# Patient Record
Sex: Female | Born: 1975 | Race: White | Hispanic: No | Marital: Married | Smoking: Never smoker
Health system: Southern US, Community
[De-identification: ages and names within clinical notes are randomized; demographics above are authoritative.]

## PROBLEM LIST (undated history)

## (undated) DIAGNOSIS — F419 Anxiety disorder, unspecified: Secondary | ICD-10-CM

## (undated) DIAGNOSIS — F329 Major depressive disorder, single episode, unspecified: Secondary | ICD-10-CM

## (undated) DIAGNOSIS — N939 Abnormal uterine and vaginal bleeding, unspecified: Secondary | ICD-10-CM

## (undated) DIAGNOSIS — F32A Depression, unspecified: Secondary | ICD-10-CM

## (undated) HISTORY — DX: Abnormal uterine and vaginal bleeding, unspecified: N93.9

---

## 1999-11-25 ENCOUNTER — Other Ambulatory Visit: Admission: RE | Admit: 1999-11-25 | Discharge: 1999-11-25 | Payer: Self-pay | Admitting: Gynecology

## 2000-02-25 ENCOUNTER — Other Ambulatory Visit: Admission: RE | Admit: 2000-02-25 | Discharge: 2000-02-25 | Payer: Self-pay | Admitting: Gynecology

## 2000-08-02 ENCOUNTER — Other Ambulatory Visit: Admission: RE | Admit: 2000-08-02 | Discharge: 2000-08-02 | Payer: Self-pay | Admitting: Obstetrics and Gynecology

## 2000-08-24 ENCOUNTER — Encounter (INDEPENDENT_AMBULATORY_CARE_PROVIDER_SITE_OTHER): Payer: Self-pay

## 2000-08-24 ENCOUNTER — Other Ambulatory Visit: Admission: RE | Admit: 2000-08-24 | Discharge: 2000-08-24 | Payer: Self-pay | Admitting: Obstetrics and Gynecology

## 2001-01-09 ENCOUNTER — Other Ambulatory Visit: Admission: RE | Admit: 2001-01-09 | Discharge: 2001-01-09 | Payer: Self-pay | Admitting: Obstetrics and Gynecology

## 2001-03-30 ENCOUNTER — Ambulatory Visit (HOSPITAL_BASED_OUTPATIENT_CLINIC_OR_DEPARTMENT_OTHER): Admission: RE | Admit: 2001-03-30 | Discharge: 2001-03-30 | Payer: Self-pay | Admitting: Surgery

## 2001-03-30 ENCOUNTER — Encounter (INDEPENDENT_AMBULATORY_CARE_PROVIDER_SITE_OTHER): Payer: Self-pay | Admitting: Specialist

## 2001-04-14 ENCOUNTER — Other Ambulatory Visit: Admission: RE | Admit: 2001-04-14 | Discharge: 2001-04-14 | Payer: Self-pay | Admitting: Obstetrics and Gynecology

## 2001-07-21 ENCOUNTER — Other Ambulatory Visit: Admission: RE | Admit: 2001-07-21 | Discharge: 2001-07-21 | Payer: Self-pay | Admitting: Obstetrics and Gynecology

## 2001-12-25 ENCOUNTER — Other Ambulatory Visit: Admission: RE | Admit: 2001-12-25 | Discharge: 2001-12-25 | Payer: Self-pay | Admitting: Gynecology

## 2002-04-04 ENCOUNTER — Other Ambulatory Visit: Admission: RE | Admit: 2002-04-04 | Discharge: 2002-04-04 | Payer: Self-pay | Admitting: *Deleted

## 2002-07-19 ENCOUNTER — Inpatient Hospital Stay (HOSPITAL_COMMUNITY): Admission: AD | Admit: 2002-07-19 | Discharge: 2002-07-21 | Payer: Self-pay | Admitting: *Deleted

## 2002-08-29 ENCOUNTER — Other Ambulatory Visit: Admission: RE | Admit: 2002-08-29 | Discharge: 2002-08-29 | Payer: Self-pay | Admitting: Gynecology

## 2003-03-22 ENCOUNTER — Other Ambulatory Visit: Admission: RE | Admit: 2003-03-22 | Discharge: 2003-03-22 | Payer: Self-pay | Admitting: Gynecology

## 2003-10-21 ENCOUNTER — Other Ambulatory Visit: Admission: RE | Admit: 2003-10-21 | Discharge: 2003-10-21 | Payer: Self-pay | Admitting: Gynecology

## 2004-10-26 ENCOUNTER — Other Ambulatory Visit: Admission: RE | Admit: 2004-10-26 | Discharge: 2004-10-26 | Payer: Self-pay | Admitting: Gynecology

## 2005-07-12 ENCOUNTER — Other Ambulatory Visit: Admission: RE | Admit: 2005-07-12 | Discharge: 2005-07-12 | Payer: Self-pay | Admitting: Gynecology

## 2005-12-14 ENCOUNTER — Ambulatory Visit (HOSPITAL_COMMUNITY): Admission: RE | Admit: 2005-12-14 | Discharge: 2005-12-14 | Payer: Self-pay | Admitting: Gynecology

## 2006-01-18 ENCOUNTER — Encounter (INDEPENDENT_AMBULATORY_CARE_PROVIDER_SITE_OTHER): Payer: Self-pay | Admitting: *Deleted

## 2006-01-18 ENCOUNTER — Inpatient Hospital Stay (HOSPITAL_COMMUNITY): Admission: AD | Admit: 2006-01-18 | Discharge: 2006-01-20 | Payer: Self-pay | Admitting: Gynecology

## 2006-02-28 ENCOUNTER — Other Ambulatory Visit: Admission: RE | Admit: 2006-02-28 | Discharge: 2006-02-28 | Payer: Self-pay | Admitting: Gynecology

## 2006-11-17 IMAGING — CR DG CHEST 2V
2 series · 2 of 2 positions shown · non-contrast
Comparison: none

CLINICAL DATA: cough and congestion
 [id number] VIEWS:
 The lungs are clear.  Cardiac and mediastinal contours are normal.  
 Patient is 34 weeks pregnant.  Patient was shielded.

[view not recorded (1 of 2)]
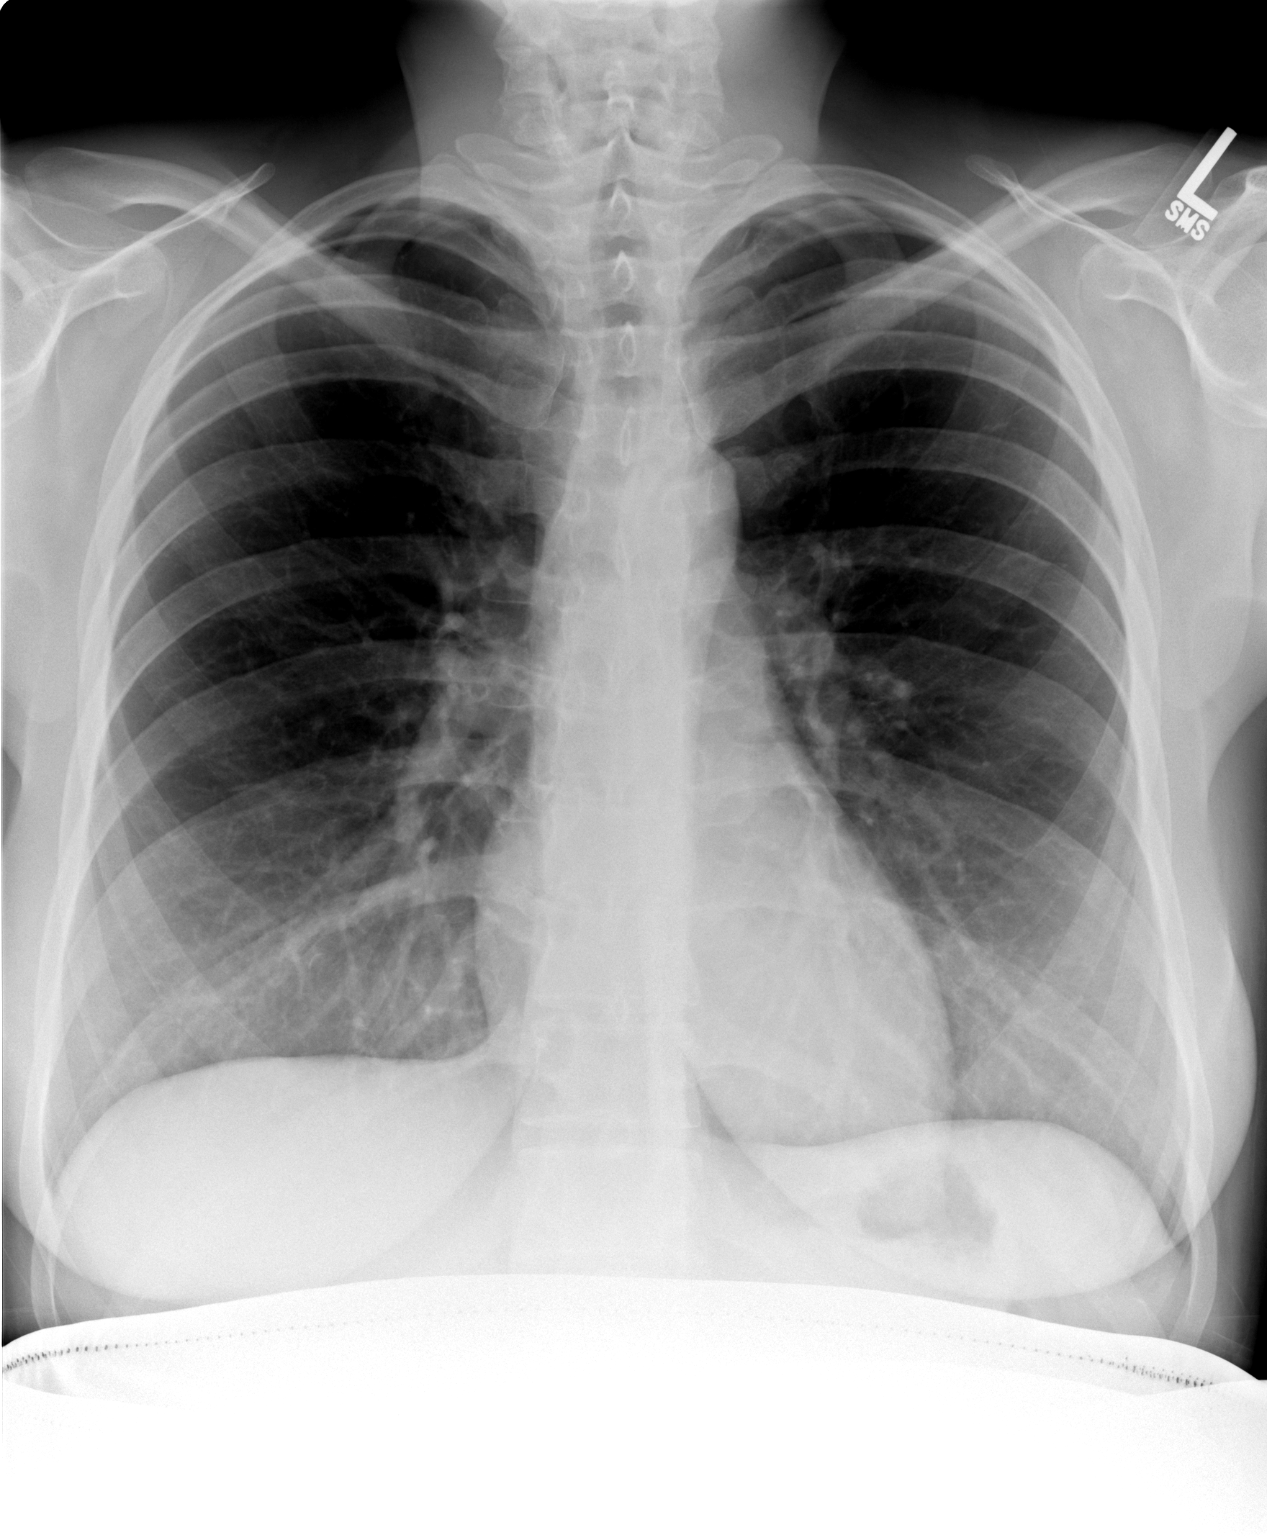

[view not recorded (2 of 2)]
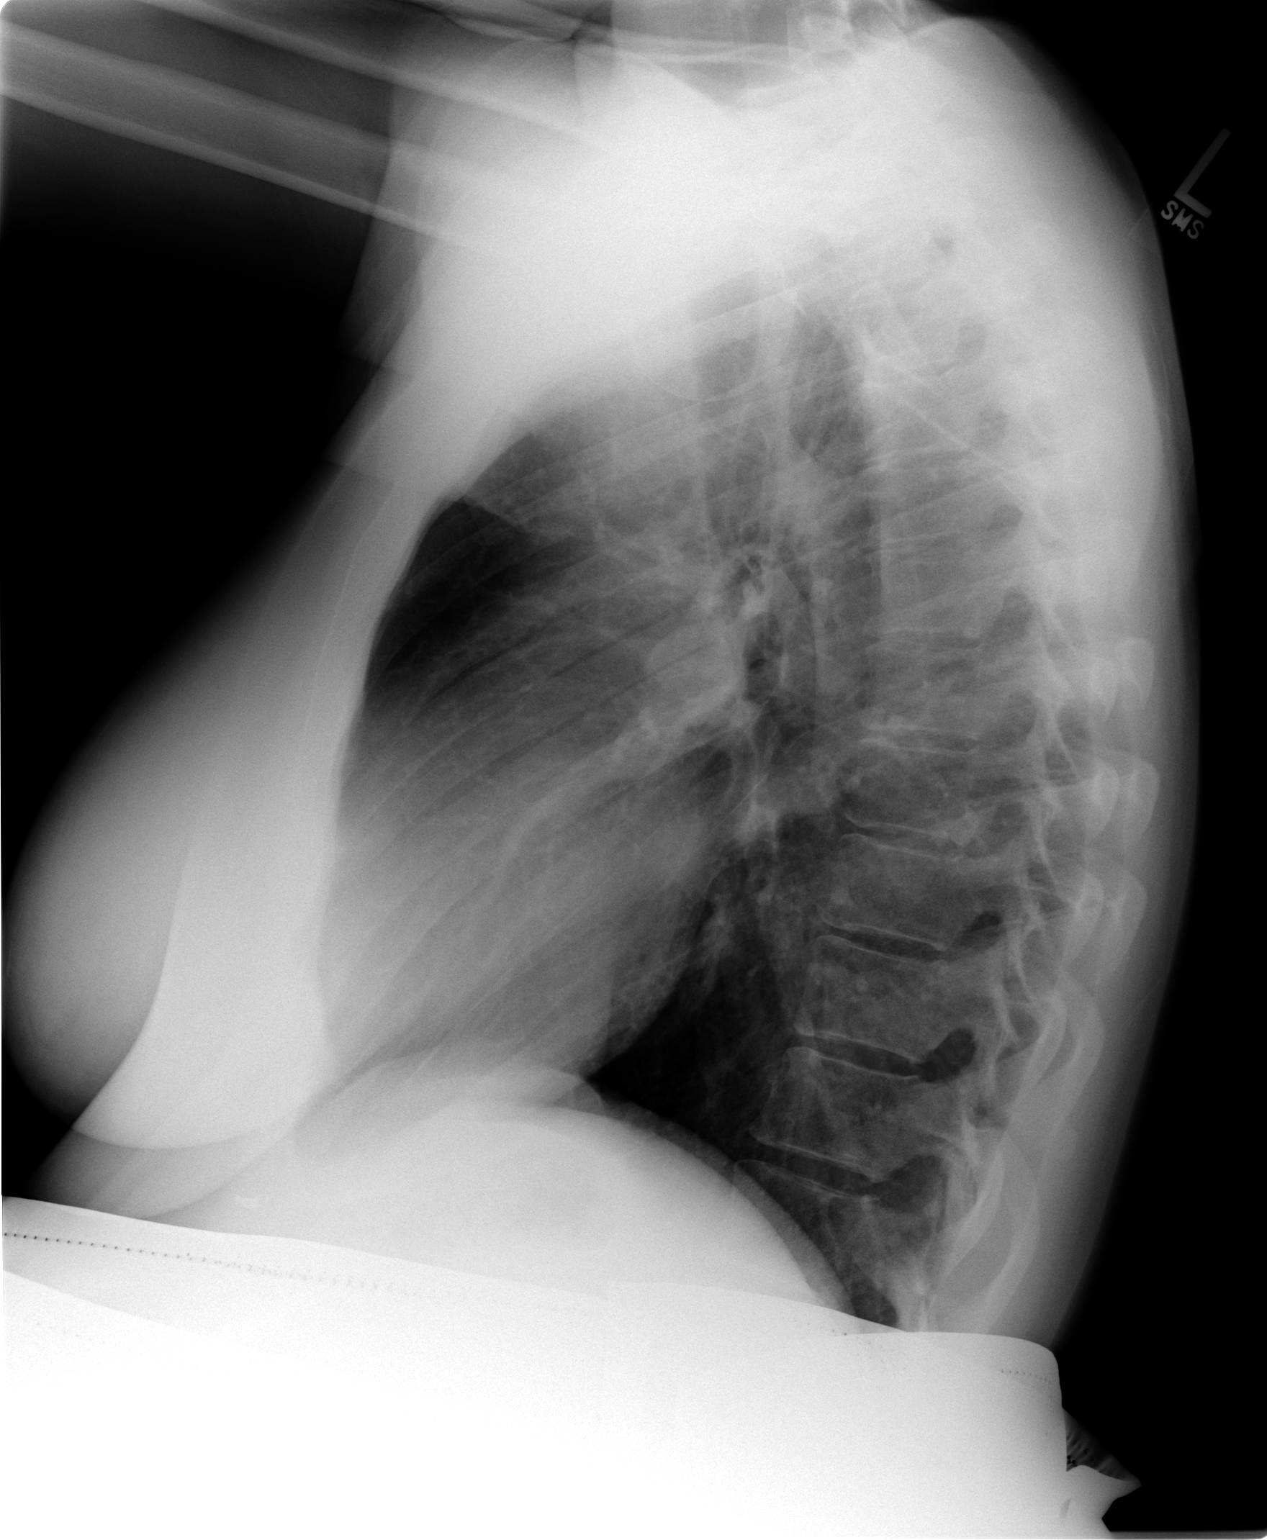

[2 of 2 positions shown; findings below may reference images not displayed]

IMPRESSION: Negative for acute cardiac or pulmonary process.

## 2007-03-06 ENCOUNTER — Other Ambulatory Visit: Admission: RE | Admit: 2007-03-06 | Discharge: 2007-03-06 | Payer: Self-pay | Admitting: Gynecology

## 2008-03-15 ENCOUNTER — Other Ambulatory Visit: Admission: RE | Admit: 2008-03-15 | Discharge: 2008-03-15 | Payer: Self-pay | Admitting: Gynecology

## 2009-03-31 ENCOUNTER — Ambulatory Visit: Payer: Self-pay | Admitting: Gynecology

## 2009-03-31 ENCOUNTER — Encounter: Payer: Self-pay | Admitting: Gynecology

## 2009-03-31 ENCOUNTER — Other Ambulatory Visit: Admission: RE | Admit: 2009-03-31 | Discharge: 2009-03-31 | Payer: Self-pay | Admitting: Gynecology

## 2010-04-03 ENCOUNTER — Ambulatory Visit: Payer: Self-pay | Admitting: Gynecology

## 2010-04-03 ENCOUNTER — Other Ambulatory Visit: Admission: RE | Admit: 2010-04-03 | Discharge: 2010-04-03 | Payer: Self-pay | Admitting: Gynecology

## 2010-10-19 ENCOUNTER — Ambulatory Visit: Payer: Self-pay | Admitting: Gynecology

## 2011-05-07 NOTE — Discharge Summary (Signed)
   Renee Harvey, Renee Harvey                        ACCOUNT NO.:  [id number]   MEDICAL RECORD NO.:  [id number]                   PATIENT TYPE:  INP   LOCATION:  9120                                 [facility]   PHYSICIAN:  Devin M. Ciliberti, M.D.            DATE OF BIRTH:  17-Jul-1976   DATE OF ADMISSION:  07/19/2002  DATE OF DISCHARGE:  07/21/2002                                 DISCHARGE SUMMARY   DISCHARGE DIAGNOSES:  1. Intrauterine pregnancy, 40+ weeks delivered, status post spontaneous     vaginal delivery.  2. Positive group B strep.   HISTORY:  This is a 35 year old female gravida 1 para 0 with an EDC of July 15, 2002.  Prenatal course was uncomplicated.   HOSPITAL COURSE:  On July 19, 2002 the patient was admitted in labor with  nausea and vomiting.  She also had a history of positive GBS, thus was begun  on Penicillin G per protocol and was given IV Reglan.  The patient  subsequently on July 19, 2002 underwent spontaneous vaginal delivery of a  female, Apgars 8 and 9, weight 8 pounds 15 ounces.  There was a partial  third degree laceration, midline episiotomy.  Postpartum the patient was  afebrile, voiding, in stable condition.  She was discharged to home on  July 21, 2002.   INSTRUCTIONS:  Given [facility] Gynecology postpartum instructions and  postpartum booklet.   ACCESSORY LABORATORY DATA:  The patient is O positive, rubella immune.  Hemoglobin on July 20, 2002 was 8.7.   DISPOSITION:  The patient is discharged to home in satisfactory condition.   FOLLOW-UP:  Informed to return to the office in six weeks; if she had any  problem prior to that time, to be seen in the office.     Susa Loffler, P.A.                    Devin M. Ciliberti, M.D.    TSG/MEDQ  D:  08/17/2002  T:  08/17/2002  Job:  [id number]

## 2011-05-07 NOTE — H&P (Signed)
Renee Harvey, Renee Harvey                        ACCOUNT NO.:  [id number]   MEDICAL RECORD NO.:  [id number]                   PATIENT TYPE:  INP   LOCATION:  9120                                 [facility]   PHYSICIAN:  Juan H. Lily Peer, M.D.             DATE OF BIRTH:  03-12-1976   DATE OF ADMISSION:  07/19/2002  DATE OF DISCHARGE:                                HISTORY & PHYSICAL   CHIEF COMPLAINT:  Contractions, nausea, and vomiting.   HISTORY OF PRESENT ILLNESS:  The patient is a 35 year old, gravida 1, para  0, with an estimated date of confinement of July 15, 2002 at apparently 40-  4/7th weeks gestation.  She was seen yesterday afternoon in the in the  office in what appeared to be early labor.  Her cervix was found to 1 cm,  80% effaced, -3 station.  She presented to the [facility] this morning  complaining of increasing intensity of her contractions as well as  frequency, apparently every five to seven minutes part, and nausea.  She was  placed on the monitor and was found to be contracting every four to seven  minutes apart with a reassuring fetal heart rate tracing.  Her vital signs  were as follows blood pressure 137/76, pulse  69, respirations 12,  temperature 98.0.   PRENATAL COURSE:  Review of her medical records indicated her prenatal  course was significant that she had ascus on her new OB Pap smear and was  repeated on April 04, 2002, which was normal.  She had some positive nausea  and vomiting early in the pregnancy which contributed to her weight loss but  we regained her weight later in the second and third trimester respectively.  She had otherwise done well through most of her pregnancy.   ALLERGIES:  The patient denies any medical allergies.   PAST MEDICAL HISTORY:  She had positive group B strep culture.  There has  never been any history of STD in the past.   REVIEW OF SYSTEMS:  See [place] form.   PHYSICAL EXAMINATION:  VITAL SIGNS:  As  described above.  HEENT:  Unremarkable.  NECK:  Supple.  Trachea midline.  No carotid bruits and no thyromegaly.  LUNGS:  Clear to auscultation without rigors or wheezes.  HEART:  Regular rate and rhythm without any murmurs or gallops.  BREASTS:  Not done.  ABDOMEN:  Gravid uterus. Vertex presentation upon [facility] maneuver.  Positive fetal heart sounds.  CERVIX:  The cervix is 3 cm dilated, 90% effaced, bulging membranes intact.  EXTREMITIES:  DTR 1+.  Clonus trace edema.   PRENATAL LABORATORY DATA:  Blood type is 0 positive, negative antibody  screen.  VDRL is nonreactive.  Rubella immune.  Hepatitis B surface antigen  and HIV were negative.  Pap smear was normal.  Alpha fetoprotein was normal.  __________ screen was normal.  Positive GBS culture.  ASSESSMENT:  A 35 year old, gravida 1, para 0, at 40-4/7th weeks gestation  in labor with advanced cervical dilatation 3+ cm, bulging membranes, 90%  effaced, intact with a reassuring fetal heart rate tracing.  Positive group  B streptococcus culture.   PLAN:  The patient will receive an epidural as per request due to discomfort  and intensity of her contractions.  Once the epidural is on board, we will  go ahead and proceed with artificial rupture of membrane and placement of  fetal scalp electrodes and intrauterine pressure catheter.  She will be  given 10 mg of Reglan IV for her nausea and she will be started on Pen-G 5  mU IV followed by 2.5 mU q.4h. secondary to her positive GBS status.  Anticipate vaginal delivery.                                               Juan H. Lily Peer, M.D.    JHF/MEDQ  D:  07/19/2002  T:  07/20/2002  Job:  [id number]

## 2011-05-21 ENCOUNTER — Encounter (HOSPITAL_COMMUNITY): Payer: Self-pay | Admitting: *Deleted

## 2011-05-22 ENCOUNTER — Inpatient Hospital Stay (HOSPITAL_COMMUNITY)
Admission: AD | Admit: 2011-05-22 | Discharge: 2011-05-24 | DRG: 373 | Disposition: A | Discharge status: Home / Self Care | Payer: BC Managed Care – PPO | Source: Ambulatory Visit | Attending: Obstetrics and Gynecology | Admitting: Obstetrics and Gynecology

## 2011-05-22 DIAGNOSIS — O09529 Supervision of elderly multigravida, unspecified trimester: Secondary | ICD-10-CM | POA: Diagnosis present

## 2011-05-22 DIAGNOSIS — O3660X0 Maternal care for excessive fetal growth, unspecified trimester, not applicable or unspecified: Principal | ICD-10-CM | POA: Diagnosis present

## 2011-05-22 LAB — CBC
HCT: 35.2 % — ABNORMAL LOW (ref 36.0–46.0)
MCHC: 33.5 g/dL (ref 30.0–36.0)
Platelets: 243 10*3/uL (ref 150–400)
RDW: 13.1 % (ref 11.5–15.5)
WBC: 8.7 10*3/uL (ref 4.0–10.5)

## 2011-05-22 LAB — RPR: RPR Ser Ql: NONREACTIVE

## 2011-05-22 LAB — ABO/RH: ABO/RH(D): O POS

## 2011-05-23 LAB — CBC
HCT: 32.3 % — ABNORMAL LOW (ref 36.0–46.0)
MCHC: 33.1 g/dL (ref 30.0–36.0)
Platelets: 222 10*3/uL (ref 150–400)
RDW: 13.1 % (ref 11.5–15.5)
WBC: 13.2 10*3/uL — ABNORMAL HIGH (ref 4.0–10.5)

## 2011-05-30 ENCOUNTER — Inpatient Hospital Stay (HOSPITAL_COMMUNITY): Admission: AD | Admit: 2011-05-30 | Payer: Self-pay | Source: Home / Self Care | Admitting: Obstetrics and Gynecology

## 2011-06-25 NOTE — H&P (Signed)
  NAMEBLIMY, Renee Harvey            ACCOUNT NO.:  [id number]  MEDICAL RECORD NO.:  [id number]           PATIENT TYPE:  I  LOCATION:  9164                          [facility]  PHYSICIAN:  Lenoard Aden, M.D.DATE OF BIRTH:  11-13-1976  DATE OF ADMISSION:  05/22/2011 DATE OF DISCHARGE:                             HISTORY & PHYSICAL   INDICATION FOR INDUCTION:  Favorable cervix with questionable LGA and a history of LGA at 39 weeks.  She is a 35 year old white female G3, P2 with an EDD of May 29, 2011, who presents as noted for induction.  Her prenatal course complicated by a slightly increased Down syndrome risk of 1 in 221 on ultra screen followed by a normal ultrasound and normal followup ultrasound also complicated by migraine headaches for which she took Fioricet.  Her medications include Fioricet p.r.n. and prenatal vitamins.  She has no known drug allergies.  Social history is noncontributory.  Family history of breast cancer, kidney stones, migraine headache.  She has a history of 2 vaginal deliveries, one 8-pound 15-ounce and one 9 pounds fetus born in an uncomplicated fashion vaginally with an epidural.  On physical exam, she is a well-developed, well-nourished white female in no acute distress.  HEENT normal.  Neck supple.  Full range of motion.  Lungs are clear.  Heart regular rhythm.  Abdomen is soft, gravid, nontender.  Estimated fetal weight 7 pounds.  Cervix is 3 cm, 50%, vertex, -1, soft, mid position.  Extremities showed no cords. Neurological exam is nonfocal.  Skin is intact.  IMPRESSION:  39-week intrauterine pregnancy with favorable cervix and presumed large gestational age with a history of large gestational age for induction.  PLAN:  Proceed with induction, epidural p.r.n.     Lenoard Aden, M.D.     RJT/MEDQ  D:  05/22/2011  T:  05/22/2011  Job:  [id number]  Electronically Signed by Olivia Mackie M.D. on 06/25/2011 07:31:26 AM

## 2013-08-02 ENCOUNTER — Other Ambulatory Visit: Payer: Self-pay | Admitting: Dermatology

## 2014-10-21 ENCOUNTER — Encounter (HOSPITAL_COMMUNITY): Payer: Self-pay | Admitting: *Deleted

## 2017-05-17 ENCOUNTER — Emergency Department (HOSPITAL_COMMUNITY)
Admission: EM | Admit: 2017-05-17 | Discharge: 2017-05-18 | Disposition: A | Discharge status: Home / Self Care | Payer: BC Managed Care – PPO | Attending: Emergency Medicine | Admitting: Emergency Medicine

## 2017-05-17 ENCOUNTER — Encounter (HOSPITAL_COMMUNITY): Payer: Self-pay | Admitting: Emergency Medicine

## 2017-05-17 DIAGNOSIS — F4321 Adjustment disorder with depressed mood: Secondary | ICD-10-CM | POA: Diagnosis present

## 2017-05-17 DIAGNOSIS — R45851 Suicidal ideations: Secondary | ICD-10-CM

## 2017-05-17 NOTE — ED Notes (Signed)
Bed: [facility] Expected date:  Expected time:  Means of arrival:  Comments: 

## 2017-05-17 NOTE — ED Notes (Signed)
Patient's husband wishes to place his information in the chart in the case the counselor or the psychiatrist have any questions.  [name] [phone] States he can be called at any time.

## 2017-05-17 NOTE — ED Triage Notes (Signed)
Patient reports she has been seeing a therapist for a month due to increased self harming thoughts. Reports her "failing marriage" is a contributing factor to these thoughts. Denies SI/HI/A/V/H at this time.   Patient reports she wants her "soon to be ex husband to have nothing to do with her care."

## 2017-05-17 NOTE — ED Notes (Signed)
ED Provider at bedside. 
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
## 2017-05-17 NOTE — ED Notes (Addendum)
Patient refusing blood work until seen by physician. Reports she "just had blood work last week."

## 2017-05-17 NOTE — ED Notes (Signed)
Bed: FB51 Expected date:  Expected time:  Means of arrival:  Comments: Triage 3

## 2017-05-17 NOTE — ED Notes (Signed)
TTS at bedside. 

## 2017-05-17 NOTE — Progress Notes (Signed)
Per Patriciaann Clan NP recommend a.m. Psych evaluation Renee Harvey, LPC-A, [facility]  Counselor 05/17/2017 11:26 PM
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
## 2017-05-17 NOTE — ED Notes (Addendum)
Pt's text to husband reads "There is nothing more to reach out for.  As my journal says, I can't do this any longer. I can't hold back the self harming thoughts anymore. They are too strong and I alone am beaten down".  This RN witnessed accuracy.

## 2017-05-18 DIAGNOSIS — F4321 Adjustment disorder with depressed mood: Secondary | ICD-10-CM

## 2017-05-18 NOTE — ED Notes (Signed)
Pt discharged home. Discharged instructions read to pt who verbalized understanding. All belongings returned to pt who signed for same. Denies SI/HI, is not delusional and not responding to internal stimuli. Escorted pt to the ED exit.

## 2017-05-18 NOTE — [facility] Suicide Risk Assessment (Signed)
Suicide Risk Assessment  Discharge Assessment   [facility] Discharge Suicide Risk Assessment   Principal Problem: Adjustment disorder with depressed mood Discharge Diagnoses:  Patient Active Problem List   Diagnosis Date Noted  . Adjustment disorder with depressed mood [F43.21] 05/18/2017    Priority: High    Total Time spent with patient: 45 minutes   Musculoskeletal: Strength & Muscle Tone: within normal limits Gait & Station: normal Patient leans: N/A  Psychiatric Specialty Exam: Physical Exam  Constitutional: She is oriented to person, place, and time. She appears well-developed and well-nourished.  HENT:  Head: Normocephalic.  Neck: Normal range of motion.  Respiratory: Effort normal.  Musculoskeletal: Normal range of motion.  Neurological: She is alert and oriented to person, place, and time.  Psychiatric: Her behavior is normal. Judgment and thought content normal. Cognition and memory are normal. She exhibits a depressed mood.    Review of Systems  Psychiatric/Behavioral: Positive for depression.  All other systems reviewed and are negative.   Blood pressure 118/77, pulse 90, temperature 98.5 F (36.9 C), temperature source Oral, resp. rate 18, last menstrual period 05/03/2017, SpO2 100 %.There is no height or weight on file to calculate BMI.  General Appearance: Casual  Eye Contact:  Good  Speech:  Normal Rate  Volume:  Normal  Mood:  Depressed  Affect:  Congruent  Thought Process:  Coherent and Descriptions of Associations: Intact  Orientation:  Full (Time, Place, and Person)  Thought Content:  WDL and Logical  Suicidal Thoughts:  No  Homicidal Thoughts:  No  Memory:  Immediate;   Good Recent;   Good Remote;   Good  Judgement:  Good  Insight:  Good  Psychomotor Activity:  Normal  Concentration:  Concentration: Good and Attention Span: Good  Recall:  Good  Fund of Knowledge:  Good  Language:  Good  Akathisia:  No  Handed:  Right  AIMS (if indicated):      Assets:  Communication Skills Desire for Improvement Financial Resources/Insurance Housing Leisure Time [name] Talents/Skills Transportation Vocational/Educational  ADL's:  Intact  Cognition:  WNL  Sleep:       Mental Status Per Nursing Assessment::   On Admission:   depression  Demographic Factors:  Caucasian  Loss Factors: Loss of significant relationship  Historical Factors: NA  Risk Reduction Factors:   Responsible for children under 56 years of age, Sense of responsibility to family, Employed, Living with another person, especially a relative, Positive social support, Positive therapeutic relationship and Positive coping skills or problem solving skills  Continued Clinical Symptoms:  Depression   Cognitive Features That Contribute To Risk:  None    Suicide Risk:  Minimal: No identifiable suicidal ideation.  Patients presenting with no risk factors but with morbid ruminations; may be classified as minimal risk based on the severity of the depressive symptoms    Plan Of Care/Follow-up recommendations:  Activity:  as tolerated Diet:  heart healthy diet  Renee Sferrazza, NP 05/18/2017, 11:13 AM

## 2017-05-18 NOTE — BH Assessment (Signed)
[place] Assessment Progress Note  Per Corena Pilgrim, MD, this pt does not require psychiatric hospitalization at this time.  Pt is to be discharged from [facility].  Pt reportedly has appointments scheduled with her current outpatient providers.  Discharge instructions advise her to continue this treatment.  Pt's nurse, Diane, has been notified.  Jalene Mullet, [place] Triage Specialist [phone]

## 2017-05-18 NOTE — Consult Note (Signed)
Renee Harvey   Reason for Harvey:  Depression  Referring Physician:  EDP Patient Identification: Renee Harvey MRN:  [id number] Principal Diagnosis: Adjustment disorder with depressed mood Diagnosis:   Patient Active Problem List   Diagnosis Date Noted  . Adjustment disorder with depressed mood [F43.21] 05/18/2017    Priority: High    Total Time spent with patient: 45 minutes  Subjective:   Renee Harvey is a 41 y.o. female patient does not warrant admission.  HPI:  41 yo female presented to the ED by her husband for depression.  Today, she denies suicidal ideations and any past attempts or psychiatric issues. States he husband insisted she come.  Calm and cooperative, looking at magazines on her bed.  No hallucinations or alcohol/drug abuse.  She is going through a divorce and frustrated with stress at home and work.  She has a 6, 88, 49 yo children.  Renee Harvey does reports having "fleeting suicidal thoughts" yesterday due to the stress but has not intent or plan to hurt herself.  She currently sees Meg Martinique for counseling and an appointment on 6/11 at [facility] with Renee Harvey.  Stable for discharge.  Past Psychiatric History: depression  Risk to Self: Suicidal Ideation: No Suicidal Intent: No Is patient at risk for suicide?: No Suicidal Plan?: No Access to Means: No What has been your use of drugs/alcohol within the last 12 months?: none How many times?: 0 Other Self Harm Risks: none Triggers for Past Attempts: Unknown Intentional Self Injurious Behavior: None Risk to Others: Homicidal Ideation: No Thoughts of Harm to Others: No Current Homicidal Intent: No Current Homicidal Plan: No Access to Homicidal Means: No Identified Victim: none History of harm to others?: No Assessment of Violence: None Noted Violent Behavior Description: n/a Does patient have access to weapons?: No Criminal Charges Pending?: No Does patient have a court  date: No Prior Inpatient Therapy: Prior Inpatient Therapy: No Prior Therapy Dates: n/a Prior Therapy Facilty/Provider(s): n/a Reason for Treatment: n/a Prior Outpatient Therapy: Prior Outpatient Therapy: Yes Prior Therapy Dates: current Prior Therapy Facilty/Provider(s): Meg Martinique, and secheduled for [facility] x 1 week Reason for Treatment: depression Does patient have an ACCT team?: No Does patient have Intensive In-House Services?  : No Does patient have [facility] services? : No Does patient have P4CC services?: No  Past Medical History: History reviewed. No pertinent past medical history. History reviewed. No pertinent surgical history. Family History: No family history on file. Family Psychiatric  History: none Social History:  History  Alcohol use Not on file     History  Drug use: Unknown    Social History   Social History  . Marital status: Married    Spouse name: N/A  . Number of children: N/A  . Years of education: N/A   Social History Main Topics  . Smoking status: None  . Smokeless tobacco: None  . Alcohol use None  . Drug use: Unknown  . Sexual activity: Not Asked   Other Topics Concern  . None   Social History Narrative  . None   Additional Social History:    Allergies:  No Known Allergies  Labs: No results found for this or any previous visit (from the past 48 hour(s)).  No current facility-administered medications for this encounter.    No current outpatient prescriptions on file.    Musculoskeletal: Strength & Muscle Tone: within normal limits Gait & Station: normal Patient leans: N/A  Psychiatric Specialty Exam: Physical Exam  Constitutional:  She is oriented to person, place, and time. She appears well-developed and well-nourished.  HENT:  Head: Normocephalic.  Neck: Normal range of motion.  Respiratory: Effort normal.  Musculoskeletal: Normal range of motion.  Neurological: She is alert and oriented to person, place, and time.   Psychiatric: Her behavior is normal. Judgment and thought content normal. Cognition and memory are normal. She exhibits a depressed mood.    Review of Systems  Psychiatric/Behavioral: Positive for depression.  All other systems reviewed and are negative.   Blood pressure 118/77, pulse 90, temperature 98.5 F (36.9 C), temperature source Oral, resp. rate 18, last menstrual period 05/03/2017, SpO2 100 %.There is no height or weight on file to calculate BMI.  General Appearance: Casual  Eye Contact:  Good  Speech:  Normal Rate  Volume:  Normal  Mood:  Depressed  Affect:  Congruent  Thought Process:  Coherent and Descriptions of Associations: Intact  Orientation:  Full (Time, Place, and Person)  Thought Content:  WDL and Logical  Suicidal Thoughts:  No  Homicidal Thoughts:  No  Memory:  Immediate;   Good Recent;   Good Remote;   Good  Judgement:  Good  Insight:  Good  Psychomotor Activity:  Normal  Concentration:  Concentration: Good and Attention Span: Good  Recall:  Good  Fund of Knowledge:  Good  Language:  Good  Akathisia:  No  Handed:  Right  AIMS (if indicated):     Assets:  Communication Skills Desire for Improvement Financial Resources/Insurance Housing Leisure Time Physical Health Resilience Social Support Talents/Skills Transportation Vocational/Educational  ADL's:  Intact  Cognition:  WNL  Sleep:        Treatment Plan Summary: Daily contact with patient to assess and evaluate symptoms and progress in treatment, Medication management and Plan adjustment disorder with depressed mood:  -Crisis stabilization -Medication management:  None started, psychiatry appointment on 6/11 and wants to wait until then to start anything -Individual counseling  Disposition: No evidence of imminent risk to self or others at present.    Waylan Boga, NP 05/18/2017 11:08 AM  Patient seen face-to-face for psychiatric evaluation, chart reviewed and case discussed with the  physician extender and developed treatment plan. Reviewed the information documented and agree with the treatment plan. Corena Pilgrim, MD

## 2017-05-18 NOTE — ED Notes (Signed)
Patient stated "just take me out of here. I want to be in my house". Appear irritated on admission but brightenes as the writer was talking to him. Denies pain, SI/HI, AH/VH at this time. Will continue to monitor patient.

## 2017-05-18 NOTE — ED Notes (Signed)
Introduced self to patient. Pt oriented to unit expectations.  Assessed pt for:  A) Anxiety &/or agitation: Pt is calm and cooperative, but concerned because she does not want to be admitted to the hospital. She has an outpatient appointment with a therapist today at 4:30 and an appointment with a psychiatrist in 1-1/2 weeks. She said that she came in voluntarily because her husband was going to IVC her. She is a Horticulturist, commercial at a school and being [place] would be bad for her job. She did not ever believe that she needs hospitalization. Denies SI/HI/AVH.   S) Safety: Safety maintained with q-15-minute checks and hourly rounds by staff.  A) ADLs: Pt able to perform ADLs independently.  P) Pick-Up (room cleanliness): Pt's room clean and free of clutter.

## 2017-05-18 NOTE — Discharge Instructions (Signed)
For your ongoing behavioral health needs, you are advised to follow up with your current providers.

## 2018-05-02 ENCOUNTER — Encounter (HOSPITAL_COMMUNITY): Payer: Self-pay | Admitting: Nurse Practitioner

## 2018-05-02 ENCOUNTER — Other Ambulatory Visit: Payer: Self-pay

## 2018-05-02 ENCOUNTER — Emergency Department (HOSPITAL_COMMUNITY)
Admission: EM | Admit: 2018-05-02 | Discharge: 2018-05-02 | Disposition: A | Discharge status: Home / Self Care | Payer: BC Managed Care – PPO | Attending: Emergency Medicine | Admitting: Emergency Medicine

## 2018-05-02 DIAGNOSIS — F329 Major depressive disorder, single episode, unspecified: Secondary | ICD-10-CM | POA: Diagnosis present

## 2018-05-02 DIAGNOSIS — R45851 Suicidal ideations: Secondary | ICD-10-CM | POA: Diagnosis not present

## 2018-05-02 DIAGNOSIS — F331 Major depressive disorder, recurrent, moderate: Secondary | ICD-10-CM

## 2018-05-02 DIAGNOSIS — Z79899 Other long term (current) drug therapy: Secondary | ICD-10-CM | POA: Insufficient documentation

## 2018-05-02 DIAGNOSIS — F419 Anxiety disorder, unspecified: Secondary | ICD-10-CM | POA: Diagnosis not present

## 2018-05-02 DIAGNOSIS — F4323 Adjustment disorder with mixed anxiety and depressed mood: Secondary | ICD-10-CM

## 2018-05-02 HISTORY — DX: Major depressive disorder, single episode, unspecified: F32.9

## 2018-05-02 HISTORY — DX: Anxiety disorder, unspecified: F41.9

## 2018-05-02 HISTORY — DX: Depression, unspecified: F32.A

## 2018-05-02 LAB — COMPREHENSIVE METABOLIC PANEL
ALK PHOS: 59 U/L (ref 38–126)
ALT: 13 U/L — ABNORMAL LOW (ref 14–54)
AST: 16 U/L (ref 15–41)
Albumin: 4.2 g/dL (ref 3.5–5.0)
Anion gap: 8 (ref 5–15)
BUN: 15 mg/dL (ref 6–20)
CALCIUM: 9.1 mg/dL (ref 8.9–10.3)
CO2: 29 mmol/L (ref 22–32)
CREATININE: 0.93 mg/dL (ref 0.44–1.00)
Chloride: 107 mmol/L (ref 101–111)
GFR calc non Af Amer: >60 mL/min (ref >60)
GLUCOSE: 119 mg/dL — AB (ref 65–99)
Potassium: 4.4 mmol/L (ref 3.5–5.1)
SODIUM: 144 mmol/L (ref 135–145)
Total Bilirubin: 0.3 mg/dL (ref 0.3–1.2)
Total Protein: 7.2 g/dL (ref 6.5–8.1)

## 2018-05-02 LAB — CBC
HEMATOCRIT: 41.8 % (ref 36.0–46.0)
HEMOGLOBIN: 13.8 g/dL (ref 12.0–15.0)
MCH: 29.7 pg (ref 26.0–34.0)
MCHC: 33 g/dL (ref 30.0–36.0)
MCV: 89.9 fL (ref 78.0–100.0)
Platelets: 302 10*3/uL (ref 150–400)
RBC: 4.65 MIL/uL (ref 3.87–5.11)
RDW: 13.2 % (ref 11.5–15.5)
WBC: 6.9 10*3/uL (ref 4.0–10.5)

## 2018-05-02 LAB — SALICYLATE LEVEL

## 2018-05-02 LAB — I-STAT BETA HCG BLOOD, ED (MC, WL, AP ONLY): I-stat hCG, quantitative: <5 m[IU]/mL (ref <5)

## 2018-05-02 LAB — RAPID URINE DRUG SCREEN, HOSP PERFORMED
Amphetamines: NOT DETECTED
BARBITURATES: NOT DETECTED
Benzodiazepines: POSITIVE — AB
Cocaine: NOT DETECTED
Opiates: NOT DETECTED
TETRAHYDROCANNABINOL: NOT DETECTED

## 2018-05-02 LAB — ETHANOL: Alcohol, Ethyl (B): <10 mg/dL (ref <10)

## 2018-05-02 LAB — ACETAMINOPHEN LEVEL: Acetaminophen (Tylenol), Serum: <10 ug/mL — ABNORMAL LOW (ref 10–30)
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
## 2018-05-02 NOTE — ED Notes (Signed)
Patient denies SI/HI/AVH. Patient is pleasant, calm and cooperative. Plan of care discussed. Patient voices no complaints or concerns at this time. Encouragement and support provided and safety maintain. Q 15 min safety checks in place and video monitoring.

## 2018-05-02 NOTE — ED Notes (Signed)
Report given to [facility] RN

## 2018-05-02 NOTE — ED Notes (Signed)
Pt reported putting several xanax in her mouth, but her husband came home and stopped her from taking them all. She did take one of them. She tearfully said that she has had increasing depression and anxiety. Pt said that she takes Vibryd and has not had it today and would like to have it. She is afraid that she will have terrible withdrawal or nightmares without it.

## 2018-05-02 NOTE — ED Notes (Signed)
AVS provided and reviewed. Understanding verbalized. Denied SI/HI/AVH. Denied pain. Pt offered no questions or concerns. Escorted off the unit, belongings returned and directed to the exit. Pt left in no acute distress.

## 2018-05-02 NOTE — ED Notes (Signed)
BELONGINGS; IN LOCKER 29; BLACK STRETCH PANTS; PURPLE T-SHIRT AND BLACK FLIP FLOPS. NO VALUABLES AND PT STATES NO OTHER BELONGINGS.
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
## 2018-05-02 NOTE — ED Provider Notes (Signed)
[facility] DEPT Provider Note   CSN: [id number] Arrival date & time: 05/02/18  1410     History   Chief Complaint Chief Complaint  Patient presents with  . Suicidal    HPI Renee Harvey is a 42 y.o. female.  Patient with hx depression, presents with increased stress, anxiety and depression. Patient indicates current stressor is poor relationship with spouse. She indicates in past weeks she has felt increasingly depressed. Is eating and drinking. Some trouble sleeping at night, but notes improved on meds. She notes she takes xanax prn, and has taken a couple more doses that per her normal, but denies overdose. Today she was noted to put several xanax in mouth but states didn't take - she states she knows she was acting irrationally then. Denies any ingestion or overdose. Denies current thoughts of harm to self or others. States follow up regularly with her psychiatrist/[facility] and with therapist. Denies etoh or substance abuse problems.   The history is provided by the patient.    Past Medical History:  Diagnosis Date  . Anxiety   . Depression     Patient Active Problem List   Diagnosis Date Noted  . Adjustment disorder with depressed mood 05/18/2017    History reviewed. No pertinent surgical history.   OB History    Gravida  1   Para      Term      Preterm      AB      Living        SAB      TAB      Ectopic      Multiple      Live Births               Home Medications    Prior to Admission medications   Medication Sig Start Date End Date Taking? Authorizing Provider  ALPRAZolam Duanne Moron) 1 MG tablet Take 1 mg by mouth daily as needed. 03/14/18  Yes [provider]  BELSOMRA 10 MG TABS Take 1 tablet by mouth at bedtime. 02/14/18  Yes [provider]  cloNIDine (CATAPRES) 0.1 MG tablet Take 0.1 mg by mouth at bedtime. 03/14/18  Yes [provider]  HORIZANT 300 MG  TBCR Take 300 mg by mouth 2 (two) times daily. 04/17/18  Yes [provider]  lamoTRIgine (LAMICTAL) 100 MG tablet Take 200 mg by mouth daily. 04/03/18  Yes [provider]  mirtazapine (REMERON) 15 MG tablet Take 7.5 mg by mouth at bedtime. 04/03/18  Yes [provider]  PENNSAID 2 % SOLN Apply 1 Pump topically 2 (two) times daily. 04/25/18  Yes [provider]  propranolol (INDERAL) 10 MG tablet Take 10 mg by mouth daily. 04/03/18  Yes [provider]    Family History History reviewed. No pertinent family history.  Social History Social History   Tobacco Use  . Smoking status: Never Smoker  . Smokeless tobacco: Never Used  Substance Use Topics  . Alcohol use: Not Currently    Frequency: Never  . Drug use: Never     Allergies   Patient has no known allergies.   Review of Systems Review of Systems  Constitutional: Negative for fever.  HENT: Negative for sore throat.   Eyes: Negative for redness.  Respiratory: Negative for cough.   Cardiovascular: Negative for chest pain.  Gastrointestinal: Negative for abdominal pain and vomiting.  Genitourinary: Negative for flank pain.  Musculoskeletal: Negative for back  pain.  Skin:       Rash left flank x 2 weeks, notes is getting much better. Not currently painful or itching.   Neurological: Negative for headaches.  Hematological: Does not bruise/bleed easily.  Psychiatric/Behavioral: Positive for dysphoric mood. The patient is nervous/anxious.      Physical Exam Updated Vital Signs Ht 1.778 m (5' 10" )   Wt 86.2 kg (190 lb)   LMP 05/03/2017   BMI 27.26 kg/m   Physical Exam  Constitutional: She appears well-developed and well-nourished. No distress.  HENT:  Head: Atraumatic.  Eyes: Pupils are equal, round, and reactive to light. Conjunctivae are normal. No scleral icterus.  Neck: Neck supple. No tracheal deviation present.  Cardiovascular: Normal rate.  Pulmonary/Chest: Effort  normal. No respiratory distress.  Abdominal: Normal appearance. She exhibits no distension. There is no tenderness.  Musculoskeletal: She exhibits no edema.  Neurological: She is alert.  Skin: Skin is warm and dry. Rash noted. She is not diaphoretic.  Single dermatome rash left flank, now dry/scabbed over, c/w resolving shingles.   Psychiatric: She has a normal mood and affect.  Nursing note and vitals reviewed.    ED Treatments / Results  Labs (all labs ordered are listed, but only abnormal results are displayed) Results for orders placed or performed during the hospital encounter of 05/02/18  Comprehensive metabolic panel  Result Value Ref Range   Sodium 144 135 - 145 mmol/L   Potassium 4.4 3.5 - 5.1 mmol/L   Chloride 107 101 - 111 mmol/L   CO2 29 22 - 32 mmol/L   Glucose, Bld 119 (H) 65 - 99 mg/dL   BUN 15 6 - 20 mg/dL   Creatinine, Ser 0.93 0.44 - 1.00 mg/dL   Calcium 9.1 8.9 - 10.3 mg/dL   Total Protein 7.2 6.5 - 8.1 g/dL   Albumin 4.2 3.5 - 5.0 g/dL   AST 16 15 - 41 U/L   ALT 13 (L) 14 - 54 U/L   Alkaline Phosphatase 59 38 - 126 U/L   Total Bilirubin 0.3 0.3 - 1.2 mg/dL   GFR calc non Af Amer >60 >60 mL/min   GFR calc Af Amer >60 >60 mL/min   Anion gap 8 5 - 15  Ethanol  Result Value Ref Range   Alcohol, Ethyl (B) <29 <51 mg/dL  Salicylate level  Result Value Ref Range   Salicylate Lvl <8.8 2.8 - 30.0 mg/dL  Acetaminophen level  Result Value Ref Range   Acetaminophen (Tylenol), Serum <10 (L) 10 - 30 ug/mL  cbc  Result Value Ref Range   WBC 6.9 4.0 - 10.5 K/uL   RBC 4.65 3.87 - 5.11 MIL/uL   Hemoglobin 13.8 12.0 - 15.0 g/dL   HCT 41.8 36.0 - 46.0 %   MCV 89.9 78.0 - 100.0 fL   MCH 29.7 26.0 - 34.0 pg   MCHC 33.0 30.0 - 36.0 g/dL   RDW 13.2 11.5 - 15.5 %   Platelets 302 150 - 400 K/uL  Rapid urine drug screen (hospital performed)  Result Value Ref Range   Opiates NONE DETECTED NONE DETECTED   Cocaine NONE DETECTED NONE DETECTED   Benzodiazepines POSITIVE  (A) NONE DETECTED   Amphetamines NONE DETECTED NONE DETECTED   Tetrahydrocannabinol NONE DETECTED NONE DETECTED   Barbiturates NONE DETECTED NONE DETECTED  I-Stat beta hCG blood, ED  Result Value Ref Range   I-stat hCG, quantitative <5.0 <5 mIU/mL   Comment 3  EKG None  Radiology No results found.  Procedures Procedures (including critical care time)  Medications Ordered in ED Medications - No data to display   Initial Impression / Assessment and Plan / ED Course  I have reviewed the triage vital signs and the nursing notes.  Pertinent labs & imaging results that were available during my care of the patient were reviewed by me and considered in my medical decision making (see chart for details).  Labs.  [place] team consulted.  Reviewed nursing notes and prior charts for additional history.   Disposition per [facility] team.  Labs reviewed - chem normal.   BH eval pending -  dispo per [facility] team.   Final Clinical Impressions(s) / ED Diagnoses   Final diagnoses:  None    ED Discharge Orders    None       Lajean Saver, MD 05/02/18 1637

## 2018-05-02 NOTE — ED Provider Notes (Signed)
Patient has been psychiatrically cleared.  Her lab work is unremarkable.  Vital signs been stable.  She denies any SI, HI, or auditory visual hallucinations.  Will be discharged with husband.    Duffy Bruce, MD 05/02/18 2137

## 2018-05-02 NOTE — Progress Notes (Signed)
Pt A & O X4. Ambulatory with a steady gait. Denies SI, HI, AVH and pain when assessed "not right now". Per pt "my husband called police and brought me here because he thought I tried to overdose on my Xenax". "my husband left the house on Mother's day with my kids when I got home from church, saying he thought I will be better by now with my depression and I'm not, he does not trust me or how much work I've put into feeling better". Reports increased depression and anxiety with current stressor being disagreement with husband about her progress with depression. Emotional support and availability provided to pt. Encouraged pt to voice concerns. Q 15 minutes safety checks maintained without self harm gestures or outburst.

## 2018-05-02 NOTE — ED Notes (Signed)
Bed: WA29 Expected date:  Expected time:  Means of arrival:  Comments: EMS-SI

## 2020-01-24 ENCOUNTER — Other Ambulatory Visit: Payer: Self-pay

## 2020-01-24 DIAGNOSIS — Z20822 Contact with and (suspected) exposure to covid-19: Secondary | ICD-10-CM

## 2020-01-25 LAB — NOVEL CORONAVIRUS, NAA: SARS-CoV-2, NAA: NOT DETECTED

## 2020-05-06 ENCOUNTER — Other Ambulatory Visit: Payer: Self-pay | Admitting: Obstetrics and Gynecology

## 2020-05-06 DIAGNOSIS — R928 Other abnormal and inconclusive findings on diagnostic imaging of breast: Secondary | ICD-10-CM

## 2020-05-07 ENCOUNTER — Other Ambulatory Visit: Payer: Self-pay

## 2020-05-07 ENCOUNTER — Ambulatory Visit
Admission: RE | Admit: 2020-05-07 | Discharge: 2020-05-07 | Disposition: A | Discharge status: Home / Self Care | Payer: BC Managed Care – PPO | Source: Ambulatory Visit | Attending: Obstetrics and Gynecology | Admitting: Obstetrics and Gynecology

## 2020-05-07 DIAGNOSIS — R928 Other abnormal and inconclusive findings on diagnostic imaging of breast: Secondary | ICD-10-CM

## 2021-04-10 IMAGING — US US BREAST*R* LIMITED INC AXILLA
1 series · 6 of 6 positions shown · non-contrast
Comparison: Previous exam(s).

CLINICAL DATA: The patient was called back from screening
mammography due to a right breast mass.

EXAM:
DIGITAL DIAGNOSTIC BILATERAL MAMMOGRAM WITH TOMO
ULTRASOUND RIGHT BREAST

[Series 1: us breast*right* limited inc axilla · 0.06mm/px · 6 of 6 slices shown]
[im 1/6]
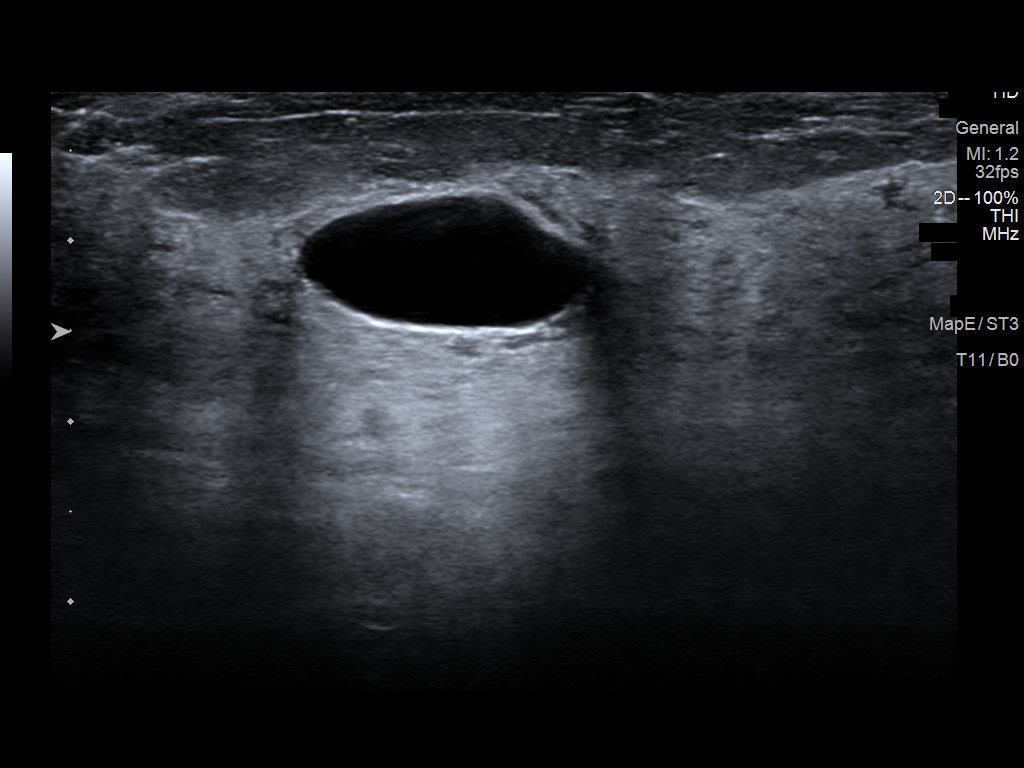
[im 2/6]
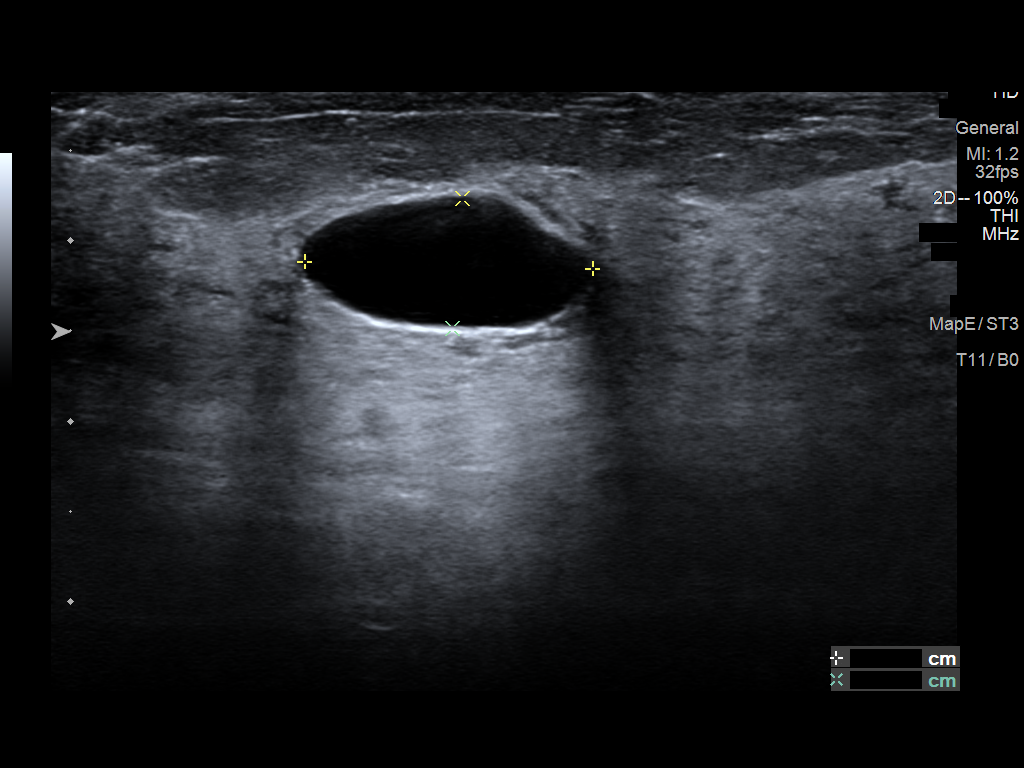
[im 3/6]
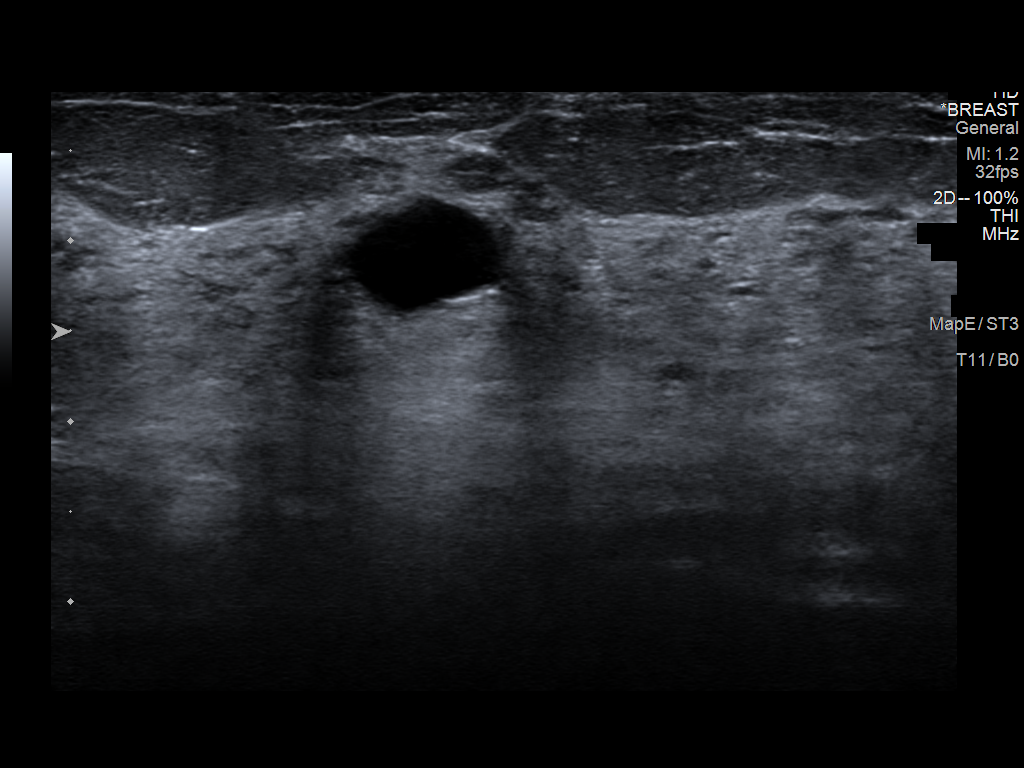
[im 4/6]
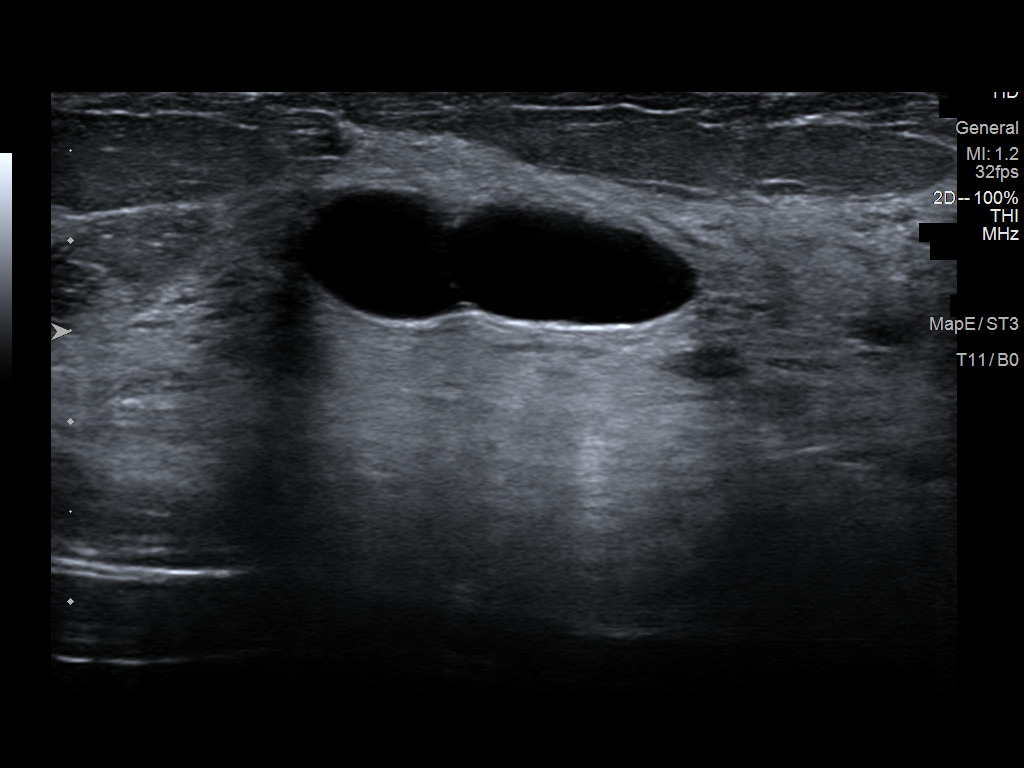
[im 5/6]
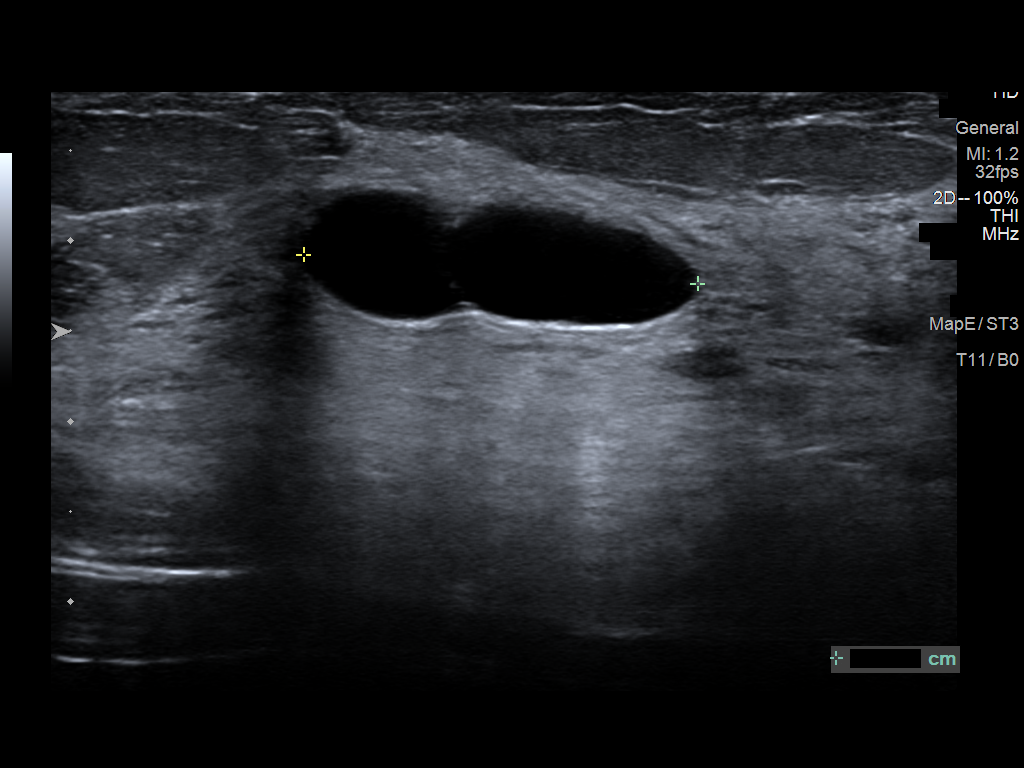
[im 6/6]
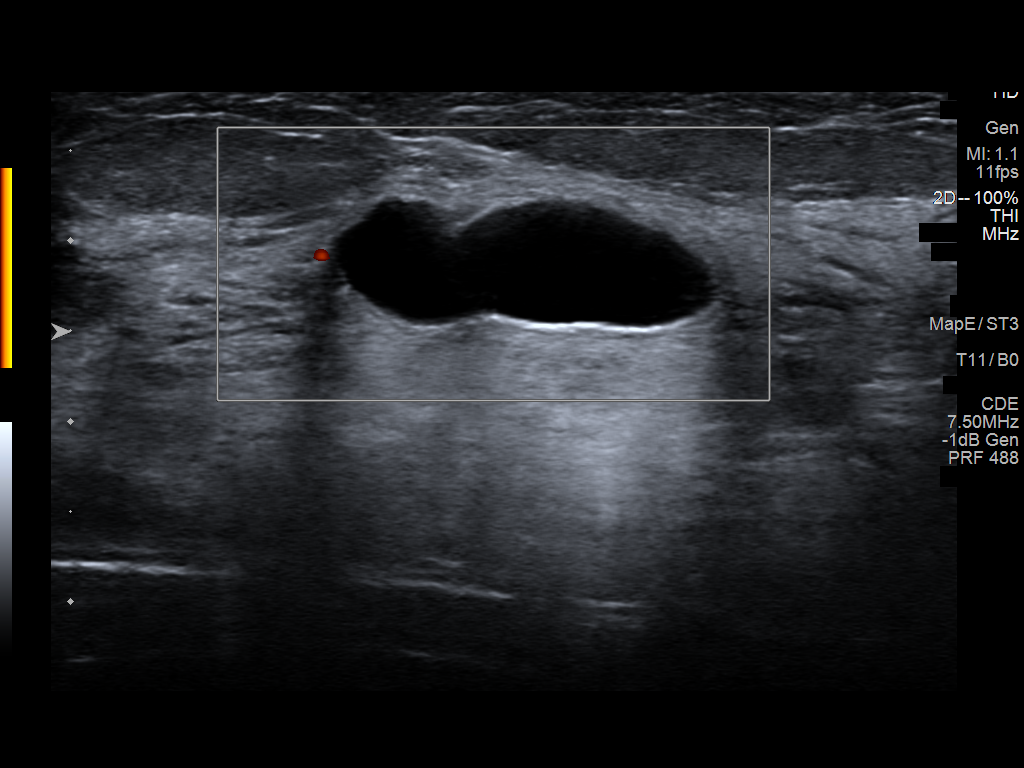

[6 of 6 positions shown; findings below may reference images not displayed]

ACR Breast Density Category c: The breast tissue is heterogeneously
dense, which may obscure small masses.
FINDINGS: The circumscribed mass in the upper outer right breast persists on
additional imaging.

On physical exam, no suspicious lumps are identified.

Targeted ultrasound is performed, showing a simple cyst in the right
breast at 11 o'clock accounting for the mammographically identified
mass.
IMPRESSION: The mammographically identified mass is a simple cysts requiring no
follow-up. No evidence of malignancy in the right breast.

RECOMMENDATION:
Annual screening mammography.

I have discussed the findings and recommendations with the patient.
If applicable, a reminder letter will be sent to the patient
regarding the next appointment.

BI-RADS CATEGORY  2: Benign.

## 2021-04-10 IMAGING — MG MM DIGITAL DIAGNOSTIC UNILAT*R* W/ TOMO W/ CAD
4 series · 4 of 12 positions shown · non-contrast
Comparison: Previous exam(s).

CLINICAL DATA: The patient was called back from screening
mammography due to a right breast mass.

EXAM:
DIGITAL DIAGNOSTIC BILATERAL MAMMOGRAM WITH TOMO
ULTRASOUND RIGHT BREAST

[R CC synth-2D]
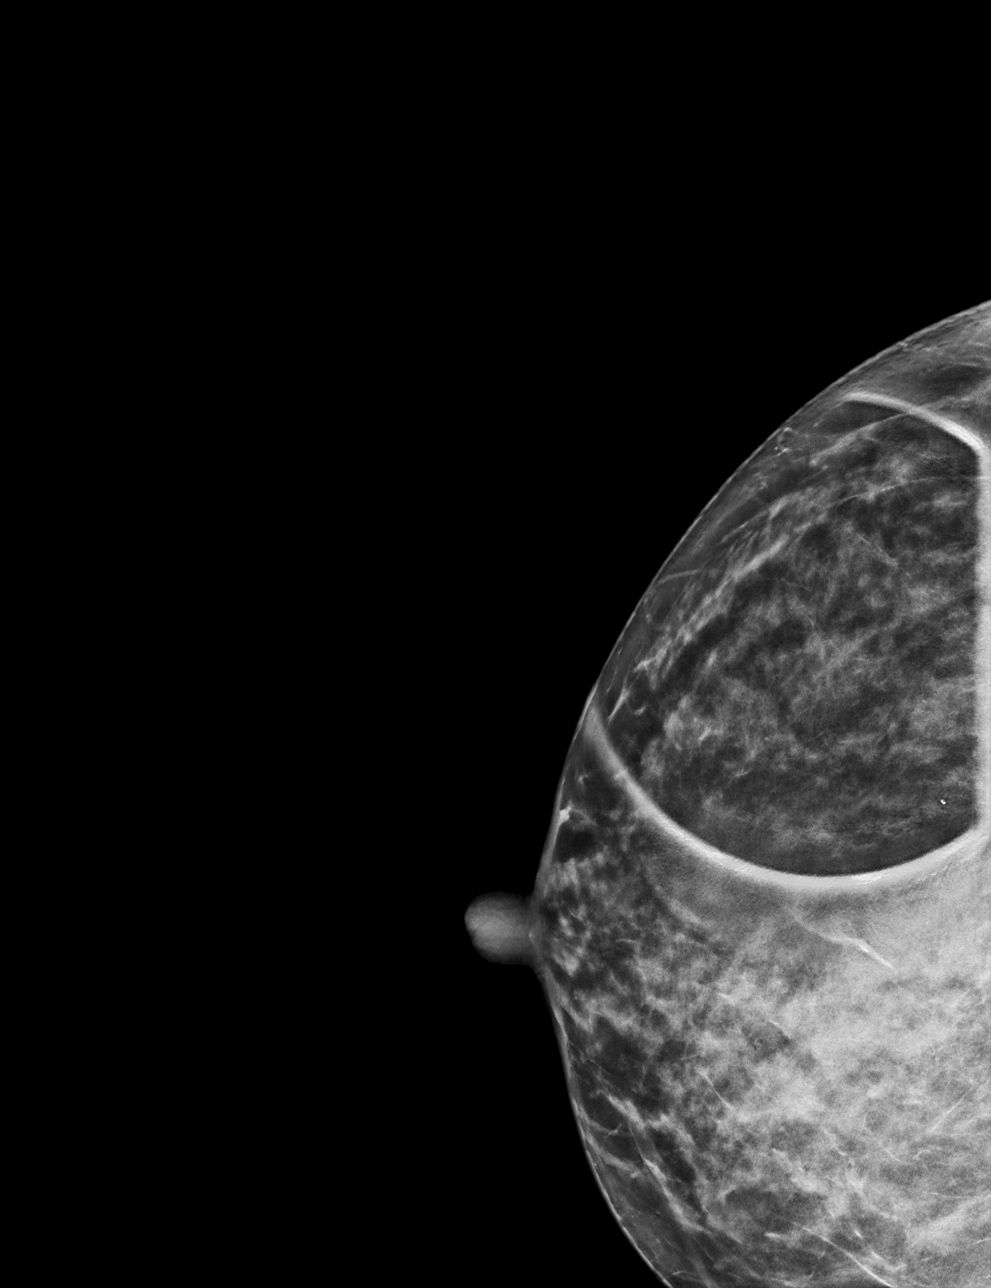

[R MLO synth-2D]
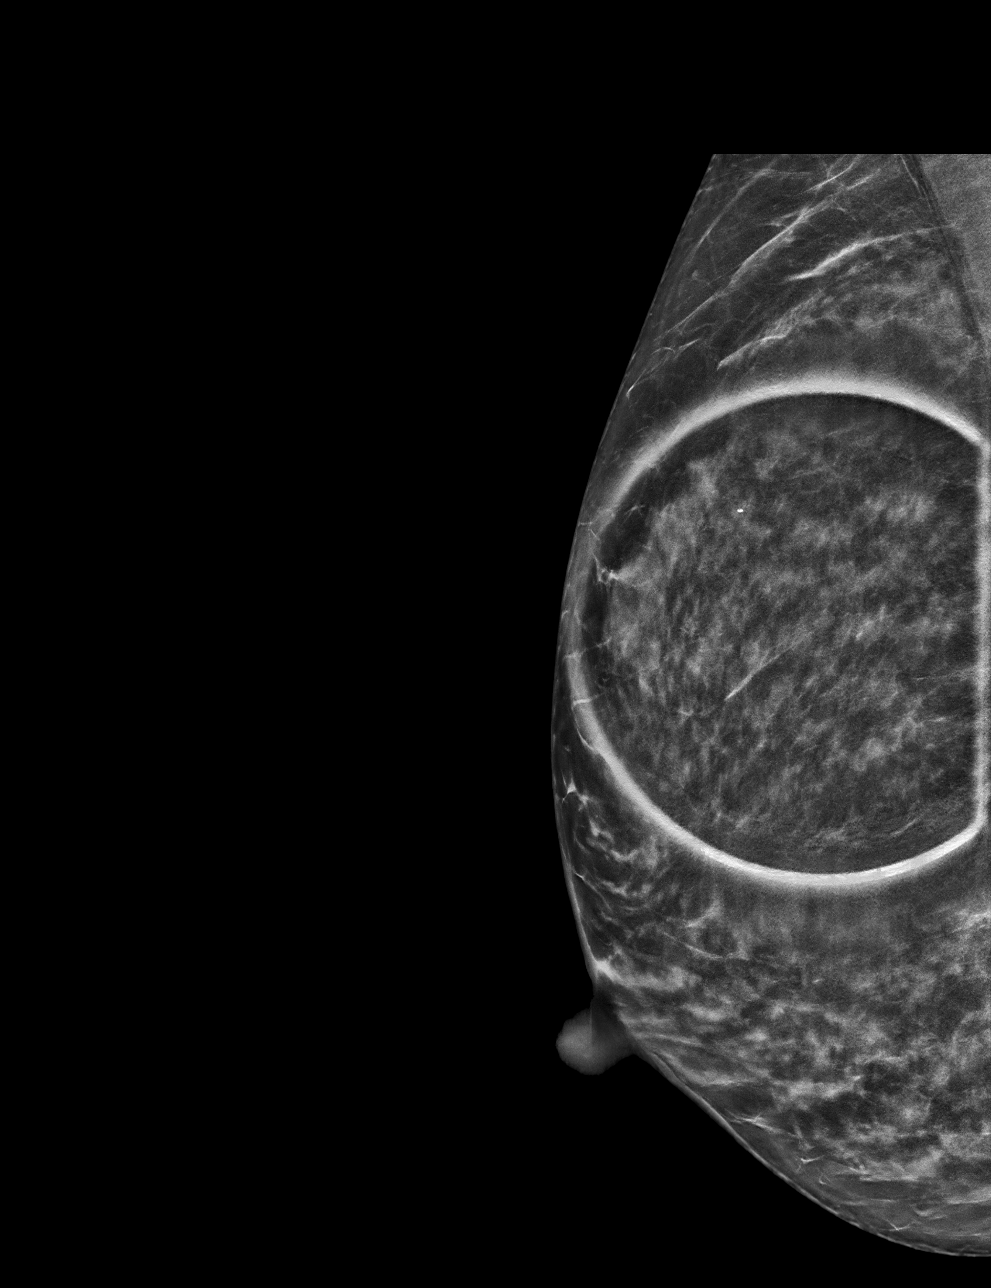

[R MLO tomo · tomo slice 33/66.0]
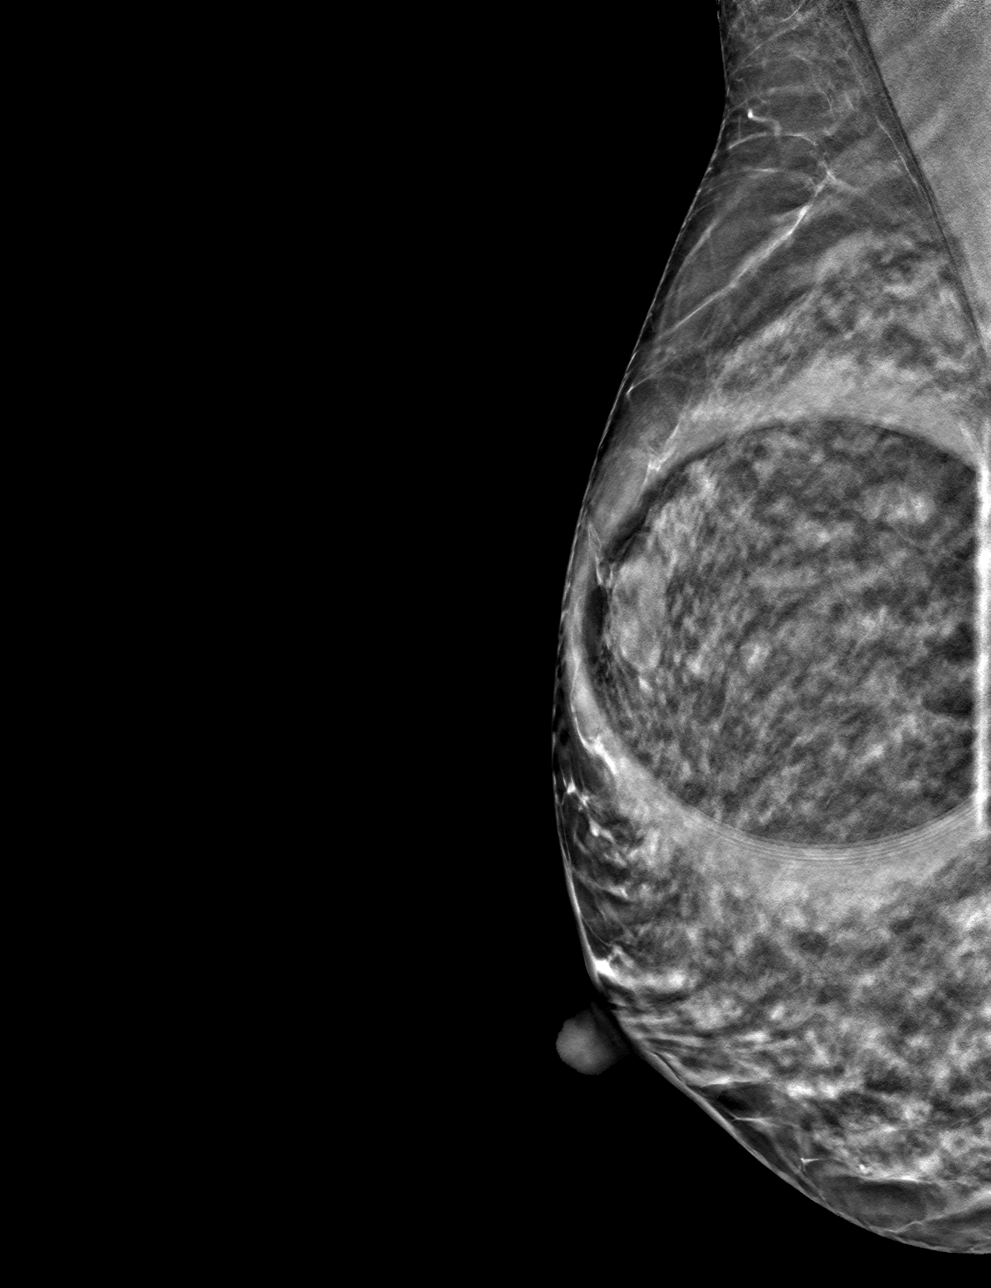

[R CC tomo · tomo slice 31/61.0]
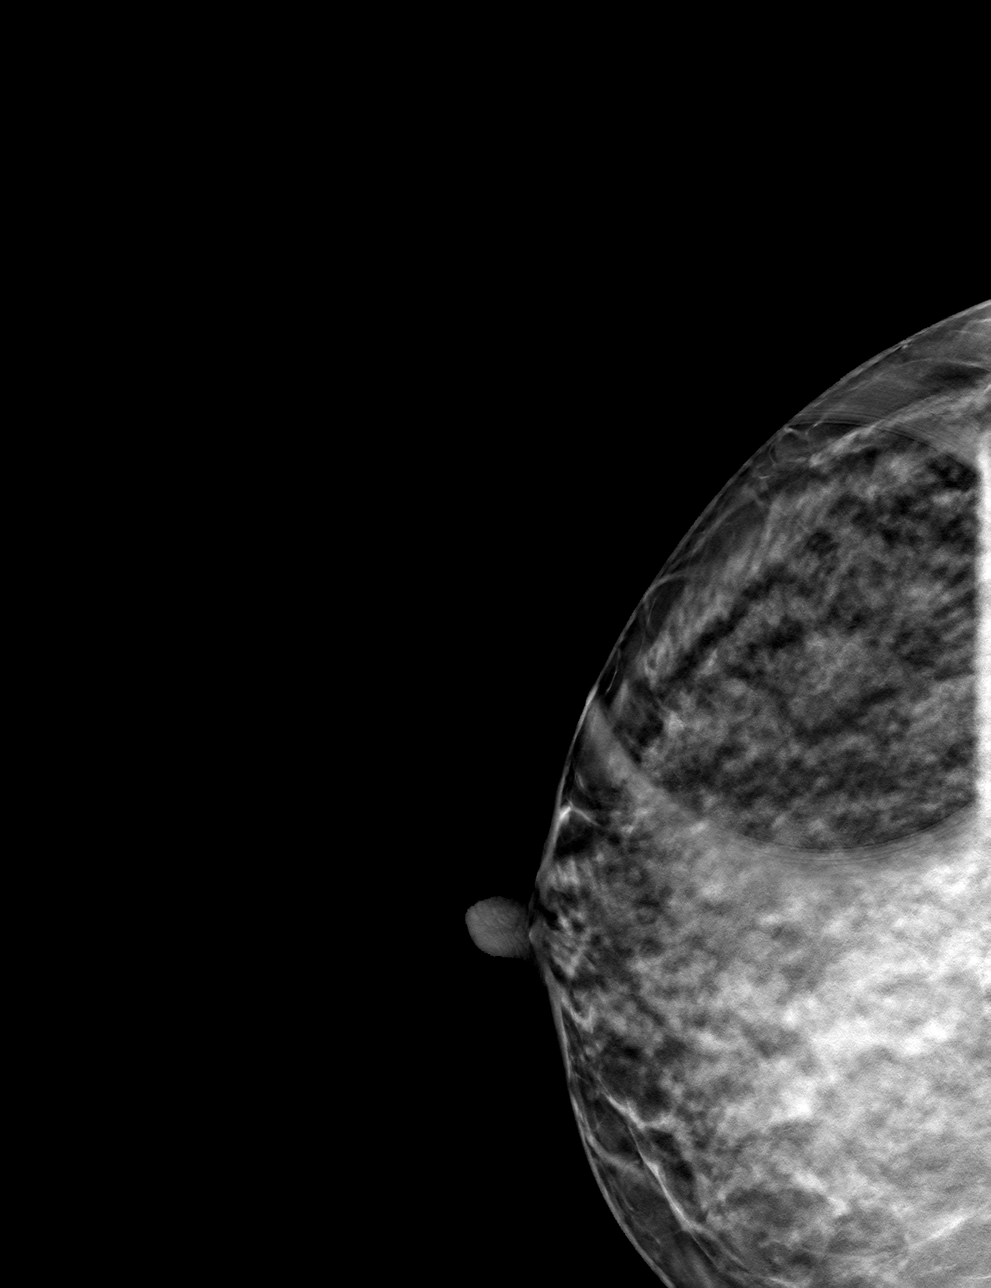

[4 of 12 positions shown; findings below may reference images not displayed]

ACR Breast Density Category c: The breast tissue is heterogeneously
dense, which may obscure small masses.
FINDINGS: The circumscribed mass in the upper outer right breast persists on
additional imaging.

On physical exam, no suspicious lumps are identified.

Targeted ultrasound is performed, showing a simple cyst in the right
breast at 11 o'clock accounting for the mammographically identified
mass.
IMPRESSION: The mammographically identified mass is a simple cysts requiring no
follow-up. No evidence of malignancy in the right breast.

RECOMMENDATION:
Annual screening mammography.

I have discussed the findings and recommendations with the patient.
If applicable, a reminder letter will be sent to the patient
regarding the next appointment.

BI-RADS CATEGORY  2: Benign.

## 2021-07-11 LAB — COLOGUARD: COLOGUARD: NEGATIVE

## 2021-07-11 LAB — EXTERNAL GENERIC LAB PROCEDURE: COLOGUARD: NEGATIVE

## 2023-09-29 ENCOUNTER — Encounter (HOSPITAL_BASED_OUTPATIENT_CLINIC_OR_DEPARTMENT_OTHER): Payer: Self-pay | Admitting: Obstetrics and Gynecology

## 2023-09-29 ENCOUNTER — Other Ambulatory Visit: Payer: Self-pay | Admitting: Obstetrics and Gynecology

## 2023-10-07 ENCOUNTER — Ambulatory Visit (HOSPITAL_BASED_OUTPATIENT_CLINIC_OR_DEPARTMENT_OTHER)
Admission: RE | Admit: 2023-10-07 | Payer: BC Managed Care – PPO | Source: Home / Self Care | Admitting: Obstetrics and Gynecology

## 2023-10-07 SURGERY — DILATATION & CURETTAGE/HYSTEROSCOPY WITH MYOSURE
Anesthesia: Choice

## 2024-03-08 LAB — COLOGUARD: COLOGUARD: NEGATIVE

## 2024-09-28 ENCOUNTER — Other Ambulatory Visit (HOSPITAL_COMMUNITY)
Admission: RE | Admit: 2024-09-28 | Discharge: 2024-09-28 | Disposition: A | Discharge status: Home / Self Care | Source: Ambulatory Visit | Attending: Obstetrics and Gynecology | Admitting: Obstetrics and Gynecology

## 2024-09-28 DIAGNOSIS — Z01419 Encounter for gynecological examination (general) (routine) without abnormal findings: Secondary | ICD-10-CM | POA: Diagnosis present

## 2024-10-02 LAB — CYTOLOGY - PAP
Comment: NEGATIVE
Diagnosis: NEGATIVE
High risk HPV: NEGATIVE
